# Patient Record
Sex: Male | Born: 1990 | Race: Black or African American | Hispanic: No | Marital: Single
Health system: Southern US, Community
[De-identification: ages and names within clinical notes are randomized; demographics above are authoritative.]

---
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
## 2009-08-03 ENCOUNTER — Emergency Department (HOSPITAL_COMMUNITY): Admission: EM | Admit: 2009-08-03 | Discharge: 2009-08-03 | Payer: Self-pay | Admitting: Emergency Medicine

## 2009-08-14 ENCOUNTER — Emergency Department (HOSPITAL_COMMUNITY): Admission: EM | Admit: 2009-08-14 | Discharge: 2009-08-14 | Payer: Self-pay | Admitting: Emergency Medicine

## 2010-06-01 NOTE — Procedures (Signed)
.     CLINICAL HISTORY:  The patient is [age] 67-1/22-year-old with syncope.  The study  is being done to look for the presence of seizures.   PROCEDURE:  The tracing is carried out on 32-channel digital Cadwell  recorder reformatted into 16-channel montages with one devoted to EKG.  The  patient was awake during the recording.  The international 10-20 system lead  placement was used.   FINDINGS:  Dominant frequency is 9 Hz rhythmic 30 mV activity that is well  modulated and regulated and attenuates partially with eye opening.   Background activity is a mixture of predominantly Alpha and upper Theta  range activity that is broadly distributed with superimposed following  predominant under 10 mV Beta.  The patient becomes drowsy with increasing  Theta and upper Delta range activity seen in the central and posterior  regions.  The patient drifts into natural sleep with vertex sharp waves  followed by symmetric and synchronous sleep spindles on a desynchronized  background.   Activating procedures were carried out.  Intermittent photic stimulation  failed to induce a definite driving response.  Hyperventilation was carried  out with potentiation of the background but no other significant change.  There was no focal slowing.  There was no interictal epileptiform activity  in the form of spikes or sharp waves.  EKG showed a regular sinus rhythm  with ventricular response of 60 beats per minute.   IMPRESSION:  In the waking state and drowsiness, this record is normal.    Chrissie Noa H. Sharene Skeans, M.D.   [id number]  D:  05/06/2003 12:34:53  T:  05/06/2003 14:32:38  Job #:  [id number]

## 2018-02-04 ENCOUNTER — Encounter (HOSPITAL_COMMUNITY): Payer: Self-pay | Admitting: *Deleted

## 2018-02-04 ENCOUNTER — Emergency Department (HOSPITAL_COMMUNITY): Payer: Self-pay

## 2018-02-04 ENCOUNTER — Emergency Department (HOSPITAL_COMMUNITY)
Admission: EM | Admit: 2018-02-04 | Discharge: 2018-02-04 | Disposition: A | Discharge status: Home / Self Care | Payer: Self-pay | Attending: Emergency Medicine | Admitting: Emergency Medicine

## 2018-02-04 DIAGNOSIS — M5441 Lumbago with sciatica, right side: Secondary | ICD-10-CM | POA: Insufficient documentation

## 2018-02-04 DIAGNOSIS — M5440 Lumbago with sciatica, unspecified side: Secondary | ICD-10-CM

## 2018-02-04 MED ORDER — NAPROXEN 250 MG PO TABS
500.0000 mg | ORAL_TABLET | Freq: Once | ORAL | Status: AC
  Administered 2018-02-04: 500 mg via ORAL
  Filled 2018-02-04: qty 2

## 2018-02-04 MED ORDER — METHOCARBAMOL 500 MG PO TABS: 500.0000 mg | ORAL_TABLET | Freq: Two times a day (BID) | ORAL | 0 refills | Status: AC

## 2018-02-04 MED ORDER — PREDNISONE 20 MG PO TABS: 40.0000 mg | ORAL_TABLET | Freq: Every day | ORAL | 0 refills | Status: AC

## 2018-02-04 MED ORDER — METHOCARBAMOL 500 MG PO TABS
500.0000 mg | ORAL_TABLET | Freq: Once | ORAL | Status: AC
  Administered 2018-02-04: 500 mg via ORAL
  Filled 2018-02-04: qty 1

## 2018-02-04 NOTE — ED Provider Notes (Signed)
[facility]   CSN: [id number] Arrival date & time: 02/04/18  1440     History   Chief Complaint Chief Complaint  Patient presents with  . Back Pain    HPI Steve Webb is a 28 y.o. male.  28 y.o male with no PMH presents to the ED with a chief complaint of back pain x 1 and a half weeks. Patient reports the symptoms began after moving furniture out of his dorm. He reports the pain is sharp located on the lower lumbar region and radiating to his right buttock and down his leg until about the back of his knee. He reports the pain is worse with movement, walking and especially applying pressure to the area. He reports pain is better in the morning and when laying on his abdomen region. He has tried ibuprofen, tylenol with no relieve in symptoms. He denies any urinary retention or bowel incontinnece, fevers, IVDU, history of CA.        Home Medications    Prior to Admission medications   Medication Sig Start Date End Date Taking? Authorizing Provider  methocarbamol (ROBAXIN) 500 MG tablet Take 1 tablet (500 mg total) by mouth 2 (two) times daily for 7 days. 02/04/18 02/11/18  Steve Webb, Steve Thivierge, PA-C  predniSONE (DELTASONE) 20 MG tablet Take 2 tablets (40 mg total) by mouth daily for 5 days. 02/04/18 02/09/18  Steve Webb, Steve Sheffer, PA-C    Family History History reviewed. No pertinent family history.  Social History Social History   Tobacco Use  . Smoking status: Not on file  Substance Use Topics  . Alcohol use: Not on file  . Drug use: Not on file     Allergies   Patient has no known allergies.   Review of Systems Review of Systems  Constitutional: Negative for fever.  Genitourinary: Negative for difficulty urinating.  Musculoskeletal: Positive for back pain and myalgias. Negative for joint swelling.     Physical Exam Updated Vital Signs BP 115/78 (BP Location: Right Arm)   Pulse 80   Temp 98.2 F (36.8 C) (Oral)    Resp 18   SpO2 99%   Physical Exam Vitals signs and nursing note reviewed.  Constitutional:      Appearance: He is well-developed. He is obese.  HENT:     Head: Normocephalic and atraumatic.  Eyes:     General: No scleral icterus.    Pupils: Pupils are equal, round, and reactive to light.  Neck:     Musculoskeletal: Normal range of motion.  Cardiovascular:     Heart sounds: Normal heart sounds.  Pulmonary:     Effort: Pulmonary effort is normal.     Breath sounds: Normal breath sounds. No wheezing.  Chest:     Chest wall: No tenderness.  Abdominal:     General: Bowel sounds are normal. There is no distension.     Palpations: Abdomen is soft.     Tenderness: There is no abdominal tenderness.  Musculoskeletal:        General: No deformity.     Lumbar back: He exhibits tenderness, edema, pain and spasm. He exhibits normal range of motion, no swelling and no deformity.       Back:     Comments: ttp on the right lower lumbar region. Pain is worse with bending at the hip. RLE- KF,KE 5/5 strength LLE- HF, HE 5/5 strength Normal gait. No pronator drift. No leg drop.  Patellar reflexes present and symmetric.  CN I, II and VIII not tested. CN II-XII grossly intact bilaterally.     Skin:    General: Skin is warm and dry.  Neurological:     Mental Status: He is alert and oriented to person, place, and time.      ED Treatments / Results  Labs (all labs ordered are listed, but only abnormal results are displayed) Labs Reviewed - No data to display  EKG None  Radiology Dg Lumbar Spine 2-3 Views  Result Date: 02/04/2018 CLINICAL DATA:  Low back pain radiating in the right leg for 1 week, no known injury, initial encounter EXAM: LUMBAR SPINE - 3 VIEW COMPARISON:  None. FINDINGS: Five lumbar type vertebral bodies are well visualized. Vertebral body height is well maintained. Mild retrolisthesis of L5 on S1 is noted which may be positional in nature. No pars defects are seen. No  significant degenerative changes are noted. IMPRESSION: Mild retrolisthesis of L5 on S1. No other focal abnormality is noted. Electronically Signed   By: Steve Webb M.D.   On: 02/04/2018 16:25    Procedures Procedures (including critical care time)  Medications Ordered in ED Medications  methocarbamol (ROBAXIN) tablet 500 mg (500 mg Oral Given 02/04/18 1529)  naproxen (NAPROSYN) tablet 500 mg (500 mg Oral Given 02/04/18 1528)     Initial Impression / Assessment and Plan / ED Course  I have reviewed the triage vital signs and the nursing notes.  Pertinent labs & imaging results that were available during my care of the patient were reviewed by me and considered in my medical decision making (see chart for details).    Patient with no previous medical history, back pain x 2 weeks. No red flags. Neuro exam is unremarkable, suspicion for epidural abscess, acute processes patient denies any previous history of IV drug use, no fevers.  Will obtain x-ray lumbar spine to rule out any acute process.  Lumbar spine showed  Mild retrolisthesis of L5 on S1. No other focal abnormality is  noted.   [name] states that he does not have a current PCP to follow-up with.  I have provided him with some naproxen along with muscle relaxer today he states relieving symptoms.  He denies any urinary symptoms at this time, low suspicion for stone disease or urinary tract infection.  He states his pain started after moving furniture, some suspicion for sciatica as patient has no red flags and pain is worse with palpation around sciatic region.  Treat patient with muscle relaxers and anti-inflammatories to help with his pain.  He currently does not have a PCP will provide him with the [place] [facility] to establish primary care.  Extensive return precautions have been given along with red flags symptoms and signs.  He is vitals stable for discharge, no hypertension or tachycardia.  Patient stable for  discharge  Final Clinical Impressions(s) / ED Diagnoses   Final diagnoses:  Acute right-sided low back pain with sciatica, sciatica laterality unspecified    ED Discharge Orders         Ordered    methocarbamol (ROBAXIN) 500 MG tablet  2 times daily     02/04/18 1657    predniSONE (DELTASONE) 20 MG tablet  Daily     02/04/18 1657           Steve Webb, Miquela Costabile, PA-C 02/04/18 1714    Pricilla LovelessGoldston, Scott, MD 02/04/18 1850

## 2018-02-04 NOTE — ED Triage Notes (Signed)
Pt in c/o lower back pain that radiates down his right leg, worse with movement

## 2018-02-04 NOTE — Discharge Instructions (Addendum)
I have prescribed muscle relaxers for your pain, please do not drink or drive while taking this medications as they can make you drowsy.    I have also prescribed steroids, be advised this medication can cause insomnia, appetite changes. Please follow-up with PCP in 1 week for reevaluation of your symptoms.  You experience any bowel or bladder incontinence, fever, worsening in your symptoms please return to the ED.  

## 2018-12-17 ENCOUNTER — Other Ambulatory Visit: Payer: Self-pay

## 2018-12-17 DIAGNOSIS — Z20822 Contact with and (suspected) exposure to covid-19: Secondary | ICD-10-CM

## 2018-12-20 LAB — NOVEL CORONAVIRUS, NAA: SARS-CoV-2, NAA: NOT DETECTED

## 2019-09-09 IMAGING — CR DG LUMBAR SPINE 2-3V
3 series · 3 of 3 positions shown · non-contrast
Comparison: None.

CLINICAL DATA: Low back pain radiating in the right leg for 1 week,
no known injury, initial encounter

EXAM:
LUMBAR SPINE - 3 VIEW

[l-spine ap]
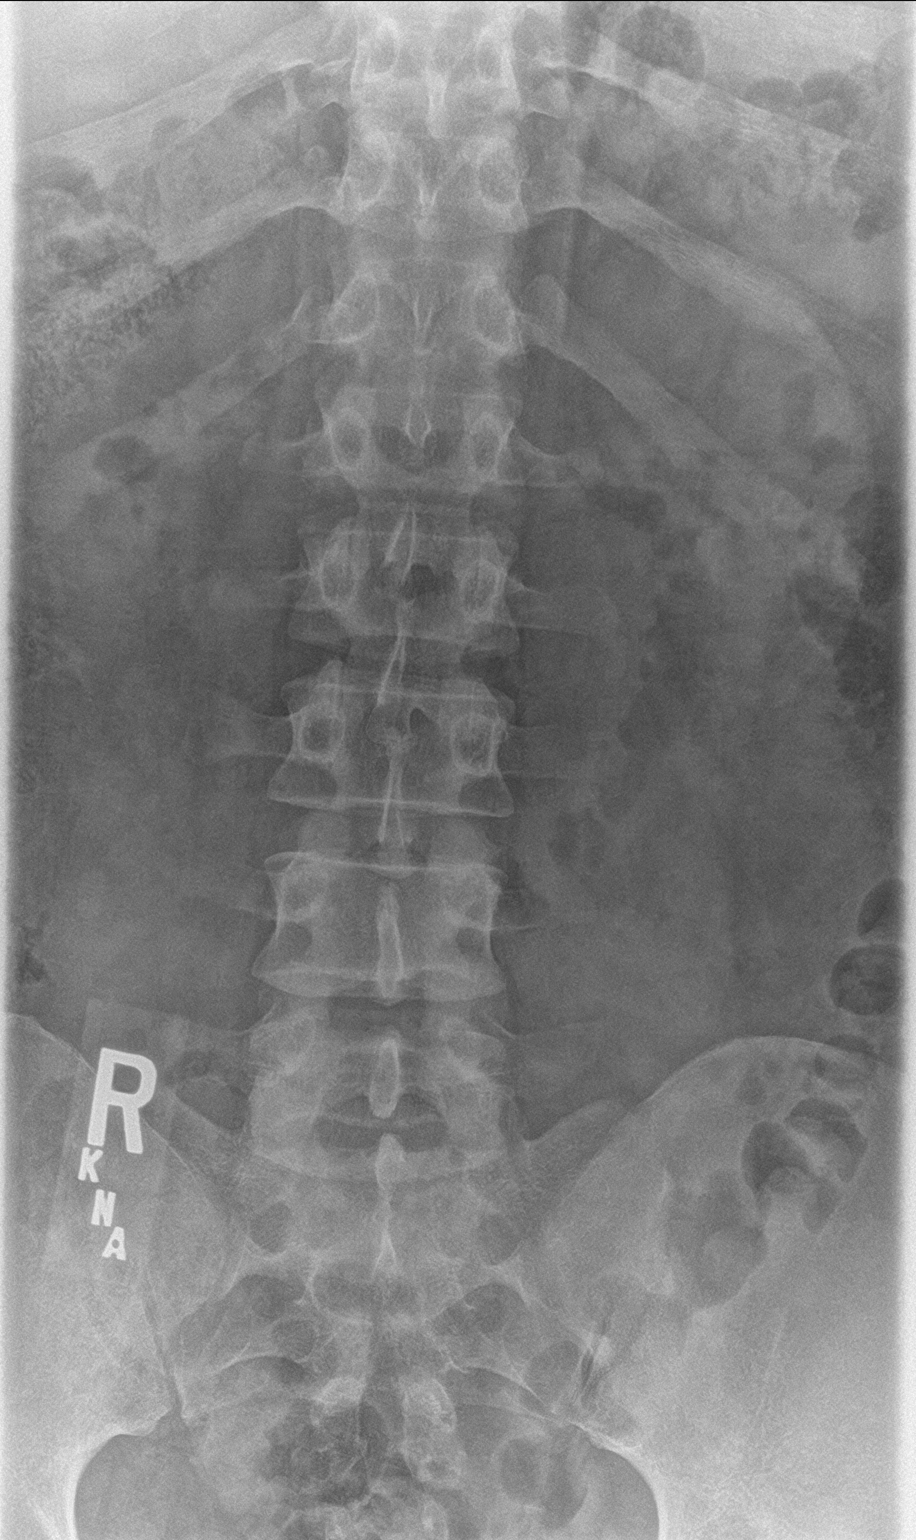

[l-spine lat]
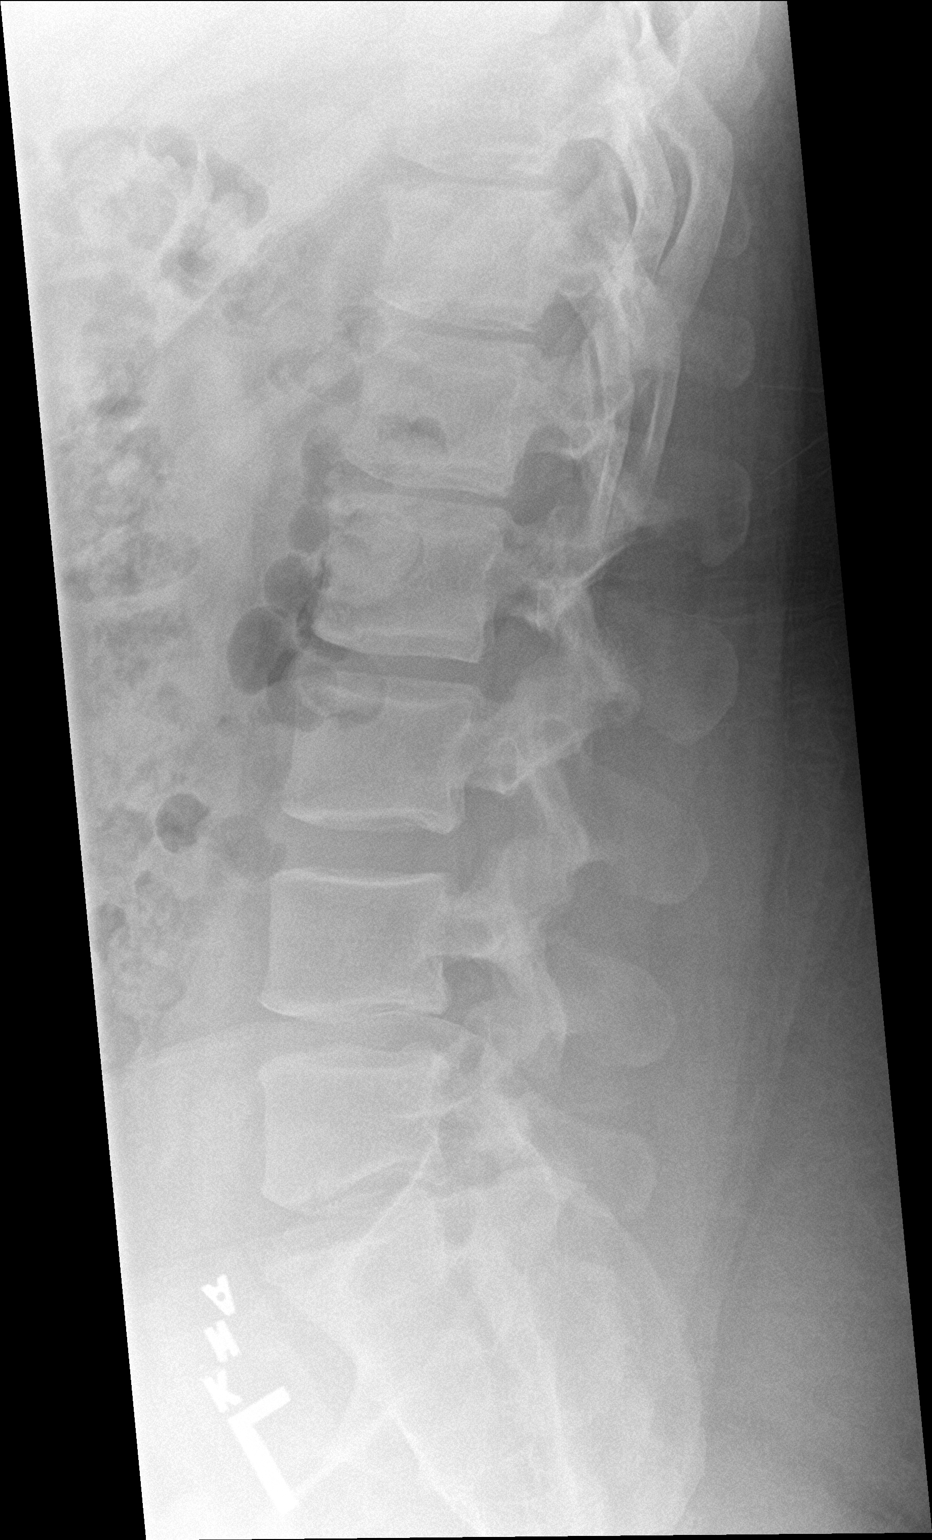

[l-spine spot]
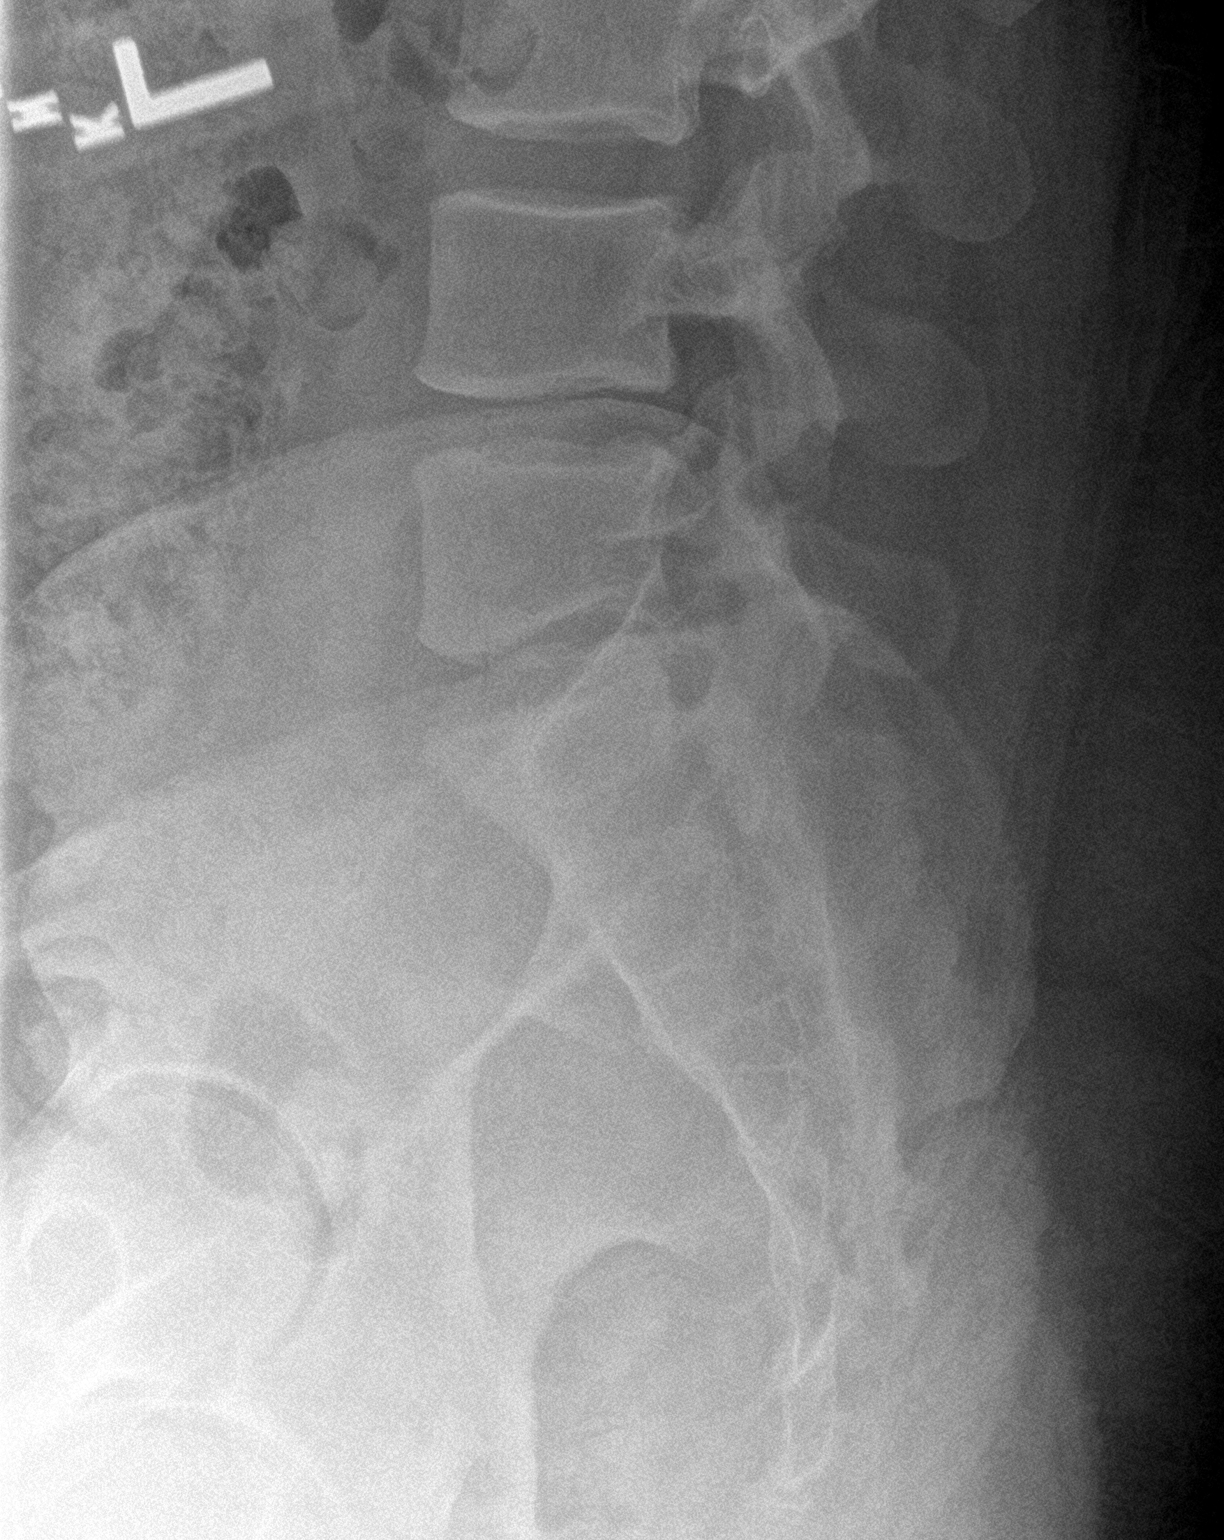

[3 of 3 positions shown; findings below may reference images not displayed]

FINDINGS: Five lumbar type vertebral bodies are well visualized. Vertebral
body height is well maintained. Mild retrolisthesis of L5 on S1 is
noted which may be positional in nature. No pars defects are seen.
No significant degenerative changes are noted.
IMPRESSION: Mild retrolisthesis of L5 on S1. No other focal abnormality is
noted.
